# Patient Record
Sex: Female | Born: 1975 | Race: White | Hispanic: No | Marital: Married | Smoking: Current every day smoker
Health system: Southern US, Community
[De-identification: ages and names within clinical notes are randomized; demographics above are authoritative.]

---

## 2005-05-17 ENCOUNTER — Emergency Department: Payer: Self-pay | Admitting: Emergency Medicine

## 2008-01-20 ENCOUNTER — Ambulatory Visit: Payer: Self-pay | Admitting: Family Medicine

## 2008-01-20 DIAGNOSIS — S62619A Displaced fracture of proximal phalanx of unspecified finger, initial encounter for closed fracture: Secondary | ICD-10-CM | POA: Insufficient documentation

## 2008-03-30 ENCOUNTER — Observation Stay: Payer: Self-pay | Admitting: Obstetrics & Gynecology

## 2008-07-03 ENCOUNTER — Observation Stay: Payer: Self-pay | Admitting: Obstetrics and Gynecology

## 2008-08-08 ENCOUNTER — Observation Stay: Payer: Self-pay | Admitting: Unknown Physician Specialty

## 2008-08-11 ENCOUNTER — Observation Stay: Payer: Self-pay | Admitting: Unknown Physician Specialty

## 2008-08-11 ENCOUNTER — Inpatient Hospital Stay: Payer: Self-pay | Admitting: Unknown Physician Specialty

## 2010-10-27 ENCOUNTER — Ambulatory Visit: Payer: Self-pay | Admitting: Internal Medicine

## 2013-06-11 ENCOUNTER — Ambulatory Visit: Payer: Self-pay

## 2018-05-02 ENCOUNTER — Other Ambulatory Visit: Payer: Self-pay | Admitting: Nurse Practitioner

## 2018-05-02 ENCOUNTER — Telehealth: Payer: Self-pay

## 2018-05-02 DIAGNOSIS — Z3041 Encounter for surveillance of contraceptive pills: Secondary | ICD-10-CM

## 2018-05-02 MED ORDER — NORGESTIM-ETH ESTRAD TRIPHASIC 0.18/0.215/0.25 MG-35 MCG PO TABS: 1.0000 | ORAL_TABLET | Freq: Every day | ORAL | 11 refills | Status: DC

## 2018-05-02 NOTE — Telephone Encounter (Signed)
Sent prescription for ortho tri cyclen to [facility] in [place]. She can keep usual appointment.

## 2018-05-02 NOTE — Telephone Encounter (Signed)
PT WAS NOTIFIED. 

## 2018-05-02 NOTE — Progress Notes (Signed)
Sent prescription for ortho tri cyclen to [facility] in [place]. She can keep usual appointment.

## 2018-08-13 ENCOUNTER — Other Ambulatory Visit: Payer: Self-pay | Admitting: Adult Health

## 2020-07-19 ENCOUNTER — Telehealth: Payer: Self-pay

## 2020-07-19 NOTE — Telephone Encounter (Signed)
LMOM of office visit on 8/11

## 2020-07-21 ENCOUNTER — Ambulatory Visit: Payer: Medicaid Other | Admitting: Adult Health

## 2020-07-21 ENCOUNTER — Other Ambulatory Visit: Payer: Self-pay

## 2020-07-21 ENCOUNTER — Encounter: Payer: Self-pay | Admitting: Adult Health

## 2020-07-21 VITALS — BP 121/79 | HR 77 | Temp 97.6°F | Resp 16 | Ht 68.0 in | Wt 131.8 lb

## 2020-07-21 DIAGNOSIS — M26621 Arthralgia of right temporomandibular joint: Secondary | ICD-10-CM

## 2020-07-21 DIAGNOSIS — Z7689 Persons encountering health services in other specified circumstances: Secondary | ICD-10-CM | POA: Diagnosis not present

## 2020-07-21 NOTE — Progress Notes (Signed)
[facility] [address] [place], [place] [address]  Internal MEDICINE  Office Visit Note  Patient Name: Karen Rollins  [id number]  [id number]  Date of Service: 07/27/2020   Complaints/HPI Pt is here for establishment of PCP. Chief Complaint  Patient presents with  . New Patient (Initial Visit)    pt fell 5 weeks ago, jaw pain, bite was off but it feels better  . Quality Metric Gaps    HepC, HIV screening, TDAP, PAP   HPI Pt is here to reestablish care. She has not been seen by a pCP for over 2 years.  She is currently separated, and has two children 51, and 38 years old.   She does not take any daily medications.  She reports 5 weeks ago while hiking, she slipped and her jaw hit her mandible on the rock after slipping.  She denies an LOC.  She does have a healing laceration submandibular.  She is concerned because the pain in the Right TMJ continues to feel discomfort.  She has good range of motion with her mandible, however it does cause discomfort with certain movements.   She reports she smokes 10-15 cigarettes daily.  She drinks alcohol occassionally,  She does use marijuana weekly.     Current Medication: Outpatient Encounter Medications as of 07/21/2020  Medication Sig  . [DISCONTINUED] Norgestimate-Ethinyl Estradiol Triphasic (ORTHO TRI-CYCLEN, 28,) 0.18/0.215/0.25 MG-35 MCG tablet Take 1 tablet by mouth daily.   No facility-administered encounter medications on file as of 07/21/2020.    Surgical History: History reviewed. No pertinent surgical history.  Medical History: History reviewed. No pertinent past medical history.  Family History: Family History  Problem Relation Age of Onset  . Cancer Mother   . Hypertension Father   . Cancer Maternal Grandmother   . Stroke Paternal Grandmother     Social History   Socioeconomic History  . Marital status: Single    Spouse name: Not on file  . Number of children: Not on file  . Years of education:  Not on file  . Highest education level: Not on file  Occupational History  . Not on file  Tobacco Use  . Smoking status: Current Every Day Smoker    Packs/day: 0.50    Types: Cigarettes  . Smokeless tobacco: Never Used  Substance and Sexual Activity  . Alcohol use: Yes    Comment: occisionally  . Drug use: Yes    Types: Marijuana  . Sexual activity: Not on file  Other Topics Concern  . Not on file  Social History Narrative  . Not on file   Social Determinants of Health   Financial Resource Strain:   . Difficulty of Paying Living Expenses:   Food Insecurity:   . Worried About Programme researcher, broadcasting/film/video in the Last Year:   . Barista in the Last Year:   Transportation Needs:   . Freight forwarder (Medical):   Marland Kitchen Lack of Transportation (Non-Medical):   Physical Activity:   . Days of Exercise per Week:   . Minutes of Exercise per Session:   Stress:   . Feeling of Stress :   Social Connections:   . Frequency of Communication with Friends and Family:   . Frequency of Social Gatherings with Friends and Family:   . Attends Religious Services:   . Active Member of Clubs or Organizations:   . Attends Banker Meetings:   Marland Kitchen Marital Status:   Intimate Partner Violence:   .  Fear of Current or Ex-Partner:   . Emotionally Abused:   Marland Kitchen Physically Abused:   . Sexually Abused:      Review of Systems  Constitutional: Negative for chills, fatigue and unexpected weight change.  HENT: Negative for congestion, rhinorrhea, sneezing and sore throat.   Eyes: Negative for photophobia, pain and redness.  Respiratory: Negative for cough, chest tightness and shortness of breath.   Cardiovascular: Negative for chest pain and palpitations.  Gastrointestinal: Negative for abdominal pain, constipation, diarrhea, nausea and vomiting.  Endocrine: Negative.   Genitourinary: Negative for dysuria and frequency.  Musculoskeletal: Negative for arthralgias, back pain, joint swelling  and neck pain.  Skin: Negative for rash.  Allergic/Immunologic: Negative.   Neurological: Negative for tremors and numbness.  Hematological: Negative for adenopathy. Does not bruise/bleed easily.  Psychiatric/Behavioral: Negative for behavioral problems and sleep disturbance. The patient is not nervous/anxious.     Vital Signs: BP 121/79   Pulse 77   Temp 97.6 F (36.4 C)   Resp 16   Ht 5\' 8"  (1.727 m)   Wt 131 lb 12.8 oz (59.8 kg)   SpO2 98%   BMI 20.04 kg/m    Physical Exam Vitals and nursing note reviewed.  Constitutional:      General: She is not in acute distress.    Appearance: She is well-developed. She is not diaphoretic.  HENT:     Head: Normocephalic and atraumatic.     Mouth/Throat:     Pharynx: No oropharyngeal exudate.  Eyes:     Pupils: Pupils are equal, round, and reactive to light.  Neck:     Thyroid: No thyromegaly.     Vascular: No JVD.     Trachea: No tracheal deviation.  Cardiovascular:     Rate and Rhythm: Normal rate and regular rhythm.     Heart sounds: Normal heart sounds. No murmur heard.  No friction rub. No gallop.   Pulmonary:     Effort: Pulmonary effort is normal. No respiratory distress.     Breath sounds: Normal breath sounds. No wheezing or rales.  Chest:     Chest wall: No tenderness.  Abdominal:     Palpations: Abdomen is soft.     Tenderness: There is no abdominal tenderness. There is no guarding.  Musculoskeletal:        General: Normal range of motion.     Cervical back: Normal range of motion and neck supple.  Lymphadenopathy:     Cervical: No cervical adenopathy.  Skin:    General: Skin is warm and dry.  Neurological:     Mental Status: She is alert and oriented to person, place, and time.     Cranial Nerves: No cranial nerve deficit.  Psychiatric:        Behavior: Behavior normal.        Thought Content: Thought content normal.        Judgment: Judgment normal.    Assessment/Plan: 1. Encounter to establish care  with new doctor Update labs for baseline at this time.  - CBC with Differential/Platelet - Lipid Panel With LDL/HDL Ratio - TSH - T4, free - Comprehensive metabolic panel  2. Arthralgia of right temporomandibular joint - CT MAXILLOFACIAL WO CONTRAST; Future  General Counseling: Junior verbalizes understanding of the findings of todays visit and agrees with plan of treatment. I have discussed any further diagnostic evaluation that may be needed or ordered today. We also reviewed her medications today. she has been encouraged to call the office with  any questions or concerns that should arise related to todays visit.  Orders Placed This Encounter  Procedures  . CT MAXILLOFACIAL WO CONTRAST  . CBC with Differential/Platelet  . Lipid Panel With LDL/HDL Ratio  . TSH  . T4, free  . Comprehensive metabolic panel    No orders of the defined types were placed in this encounter.   Time spent: 30 Minutes   This patient was seen by Blima Ledger AGNP-C in Collaboration with Dr Lyndon Code as a part of collaborative care agreement  Johnna Acosta AGNP-C Internal Medicine

## 2020-07-22 DIAGNOSIS — Z7689 Persons encountering health services in other specified circumstances: Secondary | ICD-10-CM

## 2020-08-17 ENCOUNTER — Ambulatory Visit
Admission: RE | Admit: 2020-08-17 | Discharge: 2020-08-17 | Disposition: A | Discharge status: Home / Self Care | Payer: Medicaid Other | Source: Ambulatory Visit | Attending: Adult Health | Admitting: Adult Health

## 2020-08-17 ENCOUNTER — Other Ambulatory Visit: Payer: Self-pay

## 2020-08-17 DIAGNOSIS — M26621 Arthralgia of right temporomandibular joint: Secondary | ICD-10-CM | POA: Insufficient documentation

## 2020-09-01 ENCOUNTER — Other Ambulatory Visit: Payer: Medicaid Other | Admitting: Hospice and Palliative Medicine

## 2020-09-29 ENCOUNTER — Other Ambulatory Visit: Payer: Self-pay

## 2020-09-29 ENCOUNTER — Encounter (INDEPENDENT_AMBULATORY_CARE_PROVIDER_SITE_OTHER): Payer: Self-pay

## 2020-09-29 ENCOUNTER — Ambulatory Visit: Payer: Medicaid Other | Admitting: Hospice and Palliative Medicine

## 2020-09-29 ENCOUNTER — Encounter: Payer: Self-pay | Admitting: Hospice and Palliative Medicine

## 2020-09-29 VITALS — BP 128/90 | HR 79 | Temp 97.6°F | Resp 16 | Ht 68.0 in | Wt 133.0 lb

## 2020-09-29 DIAGNOSIS — Z124 Encounter for screening for malignant neoplasm of cervix: Secondary | ICD-10-CM | POA: Diagnosis not present

## 2020-09-29 DIAGNOSIS — F411 Generalized anxiety disorder: Secondary | ICD-10-CM

## 2020-09-29 DIAGNOSIS — Z1231 Encounter for screening mammogram for malignant neoplasm of breast: Secondary | ICD-10-CM

## 2020-09-29 DIAGNOSIS — Z0001 Encounter for general adult medical examination with abnormal findings: Secondary | ICD-10-CM

## 2020-09-29 DIAGNOSIS — Z113 Encounter for screening for infections with a predominantly sexual mode of transmission: Secondary | ICD-10-CM | POA: Diagnosis not present

## 2020-09-29 DIAGNOSIS — R300 Dysuria: Secondary | ICD-10-CM

## 2020-09-29 NOTE — Progress Notes (Signed)
[facility] [address] [place], [place] [address]  Internal MEDICINE  Office Visit Note  Patient Name: Karen Rollins  [id number]  [id number]  Date of Service: 10/02/2020  Chief Complaint  Patient presents with  . Annual Exam  . Quality Metric Gaps    flu,tetnaus,covid,hep C  . controlled substance form    reviewed with PT     HPI Pt is here for routine health maintenance examination This is her second visit with [place] after establishing care as a new patient She has not yet had a chance to have her labs drawn We reviewed her CT scan of her face--had a fall on a rock and wanted to make sure she had not broken her jaw--CT negative for acute processes, pain and bruising has resolved at this time Discussed her mental health--she is moving back in with her husband whom she has been separated from for a few months, she is anxious about this move, she is happy to be getting back together with him but there are complications with his child from a previous relationship As for her stress and anxiety she does meditation and well as yoga Sleeps well each night, averages about 7-8 hours, eating and drinking with no difficulty She is currently out of a job and 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   No facility-administered encounter medications on file as of 09/29/2020.    Surgical History: History reviewed. No pertinent surgical history.  Medical History: History reviewed. No pertinent past medical history.  Family History: Family History  Problem Relation Age of Onset  . Cancer Mother   . Hypertension Father   . Cancer Maternal Grandmother   . Stroke Paternal Grandmother       Review of Systems   Constitutional: Negative for chills, diaphoresis and fatigue.  HENT: Negative for ear pain, postnasal drip and sinus pressure.   Eyes: Negative for photophobia, discharge, redness, itching and visual disturbance.  Respiratory: Negative for cough, shortness of breath and wheezing.   Cardiovascular: Negative for chest pain, palpitations and leg swelling.  Gastrointestinal: Negative for abdominal pain, constipation, diarrhea, nausea and vomiting.  Genitourinary: Negative for dysuria and flank pain.  Musculoskeletal: Negative for arthralgias, back pain, gait problem and neck pain.  Skin: Negative for color change.  Allergic/Immunologic: Negative for environmental allergies and food allergies.  Neurological: Negative for dizziness and headaches.  Hematological: Does not bruise/bleed easily.  Psychiatric/Behavioral: Negative for agitation, behavioral problems (depression) and hallucinations.     Vital Signs: BP 128/90   Pulse 79   Temp 97.6 F (36.4 C)   Resp 16   Ht 5\' 8"  (1.727 m)   Wt 133 lb (60.3 kg)   SpO2 99%   BMI 20.22 kg/m    Physical Exam Vitals reviewed.  Constitutional:      Appearance: Normal appearance. She is normal weight.  HENT:     Mouth/Throat:     Mouth: Mucous membranes are moist.     Pharynx: Oropharynx is clear.  Cardiovascular:     Rate and Rhythm: Normal rate and regular rhythm.     Pulses: Normal pulses.     Heart sounds: Normal heart sounds.  Pulmonary:     Effort: Pulmonary effort is normal.  Breath sounds: Normal breath sounds.  Chest:     Breasts:        Right: Normal.        Left: Normal.  Abdominal:     General: Abdomen is flat.     Palpations: Abdomen is soft.  Genitourinary:    General: Normal vulva.     Vagina: Normal.     Cervix: Normal.     Uterus: Normal.      Adnexa: Right adnexa normal and left adnexa normal.  Musculoskeletal:        General: Normal range of motion.     Cervical back: Normal range of motion.  Skin:     General: Skin is warm.  Neurological:     General: No focal deficit present.     Mental Status: She is alert and oriented to person, place, and time. Mental status is at baseline.  Psychiatric:        Mood and Affect: Mood is anxious.        Behavior: Behavior normal.        Thought Content: Thought content normal.     Comments: Appears anxious--fidgeting, jumps from multiple topics of conversations    LABS: Recent Results (from the past 2160 hour(s))  NuSwab Vaginitis Plus (VG+)     Status: Abnormal   Collection Time: 09/29/20 12:09 PM  Result Value Ref Range   Atopobium vaginae Moderate - 1 Score   BVAB 2 Moderate - 1 Score   Megasphaera 1 High - 2 (A) Score    Comment: Calculate total score by adding the 3 individual bacterial vaginosis (BV) marker scores together.  Total score is interpreted as follows: Total score 0-1: Indicates the absence of BV. Total score   2: Indeterminate for BV. Additional clinical                  data should be evaluated to establish a                  diagnosis. Total score 3-6: Indicates the presence of BV. This test was developed and its performance characteristics determined by [facility].  It has not been cleared or approved by the Food and Drug Administration.    Candida albicans, NAA Negative Negative   Candida glabrata, NAA Negative Negative   Trich vag by NAA Negative Negative   Chlamydia trachomatis, NAA Negative Negative   Neisseria gonorrhoeae, NAA Negative Negative  UA/M w/rflx Culture, Routine     Status: None   Collection Time: 09/29/20 12:09 PM   Specimen: Urine   Urine  Result Value Ref Range   Specific Gravity, UA 1.007 1.005 - 1.030   pH, UA 7.0 5.0 - 7.5   Color, UA Yellow Yellow   Appearance Ur Clear Clear   Leukocytes,UA Negative Negative   Protein,UA Negative Negative/Trace   Glucose, UA Negative Negative   Ketones, UA Negative Negative   RBC, UA Negative Negative   Bilirubin, UA Negative Negative   Urobilinogen, Ur  0.2 0.2 - 1.0 mg/dL   Nitrite, UA Negative Negative   Microscopic Examination Comment     Comment: Microscopic follows if indicated.   Microscopic Examination See below:     Comment: Microscopic was indicated and was performed.   Urinalysis Reflex Comment     Comment: This specimen will not reflex to a Urine Culture.  Microscopic Examination     Status: Abnormal   Collection Time: 09/29/20 12:09 PM   Urine  Result Value Ref Range  WBC, UA None seen 0 - 5 /hpf   RBC 3-10 (A) 0 - 2 /hpf   Epithelial Cells (non renal) None seen 0 - 10 /hpf   Casts None seen None seen /lpf   Bacteria, UA None seen None seen/Few    Assessment/Plan: 1. Encounter for routine adult health examination with abnormal findings Well appearing 44 year old female, advised to have labs drawn for review  2. Encounter for screening mammogram for malignant neoplasm of breast Screening mammogram per guidelines - MM Digital Screening; Future  3. Generalized anxiety disorder Continue to manage anxiety through meditation and yoga--not interested in medication at this time, will continue to closely monitor  4. Routine cervical smear - IGP, Aptima HPV  5. Screening examination for venereal disease - NuSwab Vaginitis Plus (VG+)  6. Dysuria - UA/M w/rflx Culture, Routine - Microscopic Examination  General Counseling: Juley verbalizes understanding of the findings of todays visit and agrees with plan of treatment. I have discussed any further diagnostic evaluation that may be needed or ordered today. We also reviewed her medications today. she has been encouraged to call the office with any questions or concerns that should arise related to todays visit.    Counseling:    Orders Placed This Encounter  Procedures  . Microscopic Examination  . MM Digital Screening  . NuSwab Vaginitis Plus (VG+)  . UA/M w/rflx Culture, Routine      Total time spent: 30 Minutes  Time spent includes review of chart,  medications, test results, and follow up plan with the patient.   This patient was seen by Brent General AGNP-C Collaboration with Dr Lyndon Code as a part of collaborative care agreement   Lubertha Basque. [facility] Internal Medicine

## 2020-09-30 LAB — UA/M W/RFLX CULTURE, ROUTINE
Bilirubin, UA: NEGATIVE
Glucose, UA: NEGATIVE
Ketones, UA: NEGATIVE
Leukocytes,UA: NEGATIVE
Nitrite, UA: NEGATIVE
Protein,UA: NEGATIVE
RBC, UA: NEGATIVE
Specific Gravity, UA: 1.007 (ref 1.005–1.030)
Urobilinogen, Ur: 0.2 mg/dL (ref 0.2–1.0)
pH, UA: 7 (ref 5.0–7.5)

## 2020-09-30 LAB — MICROSCOPIC EXAMINATION
Bacteria, UA: NONE SEEN
Casts: NONE SEEN /lpf
Epithelial Cells (non renal): NONE SEEN /hpf (ref 0–10)
WBC, UA: NONE SEEN /hpf (ref 0–5)

## 2020-10-01 ENCOUNTER — Other Ambulatory Visit: Payer: Self-pay | Admitting: Hospice and Palliative Medicine

## 2020-10-01 ENCOUNTER — Telehealth: Payer: Self-pay

## 2020-10-01 DIAGNOSIS — Z1231 Encounter for screening mammogram for malignant neoplasm of breast: Secondary | ICD-10-CM

## 2020-10-01 LAB — NUSWAB VAGINITIS PLUS (VG+)
Candida albicans, NAA: NEGATIVE
Candida glabrata, NAA: NEGATIVE
Chlamydia trachomatis, NAA: NEGATIVE
Megasphaera 1: HIGH Score — AB
Neisseria gonorrhoeae, NAA: NEGATIVE
Trich vag by NAA: NEGATIVE

## 2020-10-01 MED ORDER — METRONIDAZOLE 500 MG PO TABS
500.0000 mg | ORAL_TABLET | Freq: Two times a day (BID) | ORAL | 0 refills | Status: DC
Start: 2020-10-01 — End: 2022-11-10

## 2020-10-01 NOTE — Telephone Encounter (Signed)
-----   Message from Theotis Burrow, NP sent at 10/01/2020 12:42 PM EDT ----- Please call patient and let her know her vaginal swab came back positive for bacterial vaginosis. I have called in metronidazole 500 mg BID for 7 days. Avoid all alcohol while taking this medication.

## 2020-10-01 NOTE — Telephone Encounter (Signed)
Pt.notified

## 2020-10-01 NOTE — Progress Notes (Signed)
Please call patient and let her know her vaginal swab came back positive for bacterial vaginosis. I have called in metronidazole 500 mg BID for 7 days. Avoid all alcohol while taking this medication.

## 2020-10-02 ENCOUNTER — Encounter: Payer: Self-pay | Admitting: Hospice and Palliative Medicine

## 2020-10-06 LAB — IGP, APTIMA HPV: HPV Aptima: NEGATIVE

## 2020-10-07 LAB — LIPID PANEL WITH LDL/HDL RATIO

## 2020-10-08 LAB — LIPID PANEL WITH LDL/HDL RATIO
Cholesterol, Total: 265 mg/dL — ABNORMAL HIGH (ref 100–199)
HDL: 52 mg/dL (ref >39)
LDL Chol Calc (NIH): 203 mg/dL — ABNORMAL HIGH (ref 0–99)
LDL/HDL Ratio: 3.9 ratio — ABNORMAL HIGH (ref 0.0–3.2)
Triglycerides: 66 mg/dL (ref 0–149)
VLDL Cholesterol Cal: 10 mg/dL (ref 5–40)

## 2020-10-08 LAB — COMPREHENSIVE METABOLIC PANEL
ALT: 9 IU/L (ref 0–32)
AST: 12 IU/L (ref 0–40)
Albumin/Globulin Ratio: 1.8 (ref 1.2–2.2)
Albumin: 4.2 g/dL (ref 3.8–4.8)
Alkaline Phosphatase: 75 IU/L (ref 44–121)
BUN/Creatinine Ratio: 14 (ref 9–23)
BUN: 10 mg/dL (ref 6–24)
Bilirubin Total: 0.4 mg/dL (ref 0.0–1.2)
CO2: 22 mmol/L (ref 20–29)
Calcium: 9.2 mg/dL (ref 8.7–10.2)
Chloride: 104 mmol/L (ref 96–106)
Creatinine, Ser: 0.74 mg/dL (ref 0.57–1.00)
GFR calc Af Amer: 116 mL/min/{1.73_m2} (ref >59)
GFR calc non Af Amer: 100 mL/min/{1.73_m2} (ref >59)
Globulin, Total: 2.3 g/dL (ref 1.5–4.5)
Glucose: 86 mg/dL (ref 65–99)
Potassium: 4.1 mmol/L (ref 3.5–5.2)
Sodium: 139 mmol/L (ref 134–144)
Total Protein: 6.5 g/dL (ref 6.0–8.5)

## 2020-10-08 LAB — CBC WITH DIFFERENTIAL/PLATELET
Basophils Absolute: 0 10*3/uL (ref 0.0–0.2)
Basos: 1 %
EOS (ABSOLUTE): 0.3 10*3/uL (ref 0.0–0.4)
Eos: 5 %
Hematocrit: 40 % (ref 34.0–46.6)
Hemoglobin: 13.2 g/dL (ref 11.1–15.9)
Immature Grans (Abs): 0 10*3/uL (ref 0.0–0.1)
Immature Granulocytes: 0 %
Lymphocytes Absolute: 2 10*3/uL (ref 0.7–3.1)
Lymphs: 31 %
MCH: 31.7 pg (ref 26.6–33.0)
MCHC: 33 g/dL (ref 31.5–35.7)
MCV: 96 fL (ref 79–97)
Monocytes Absolute: 0.5 10*3/uL (ref 0.1–0.9)
Monocytes: 7 %
Neutrophils Absolute: 3.7 10*3/uL (ref 1.4–7.0)
Neutrophils: 56 %
Platelets: 261 10*3/uL (ref 150–450)
RBC: 4.17 x10E6/uL (ref 3.77–5.28)
RDW: 12.3 % (ref 11.7–15.4)
WBC: 6.6 10*3/uL (ref 3.4–10.8)

## 2020-10-08 LAB — T4, FREE: Free T4: 1.27 ng/dL (ref 0.82–1.77)

## 2020-10-08 LAB — TSH: TSH: 0.91 u[IU]/mL (ref 0.450–4.500)

## 2020-10-26 ENCOUNTER — Telehealth: Payer: Self-pay

## 2020-10-26 NOTE — Telephone Encounter (Signed)
-----   Message from Lyndon Code, MD sent at 10/22/2020  6:46 PM EST ----- Her cholesterol is high, can you please ask her if she was fasting for her labs

## 2020-10-27 ENCOUNTER — Telehealth: Payer: Self-pay

## 2020-10-27 ENCOUNTER — Other Ambulatory Visit: Payer: Self-pay | Admitting: Hospice and Palliative Medicine

## 2020-10-27 DIAGNOSIS — Z13220 Encounter for screening for lipoid disorders: Secondary | ICD-10-CM

## 2020-10-27 NOTE — Telephone Encounter (Signed)
LMOM to let pt know to have another lab done for lipid panel and to make sure she is fasting for 8 hours.

## 2020-10-27 NOTE — Telephone Encounter (Signed)
-----   Message from Theotis Burrow, NP sent at 10/27/2020 12:42 PM EST ----- Please call her back and have her repeat her lipid panel--make sure she understands she needs to fast for 8 hours prior to having labs done. Thanks.

## 2020-11-10 ENCOUNTER — Other Ambulatory Visit: Payer: Self-pay

## 2020-11-10 ENCOUNTER — Ambulatory Visit
Admission: RE | Admit: 2020-11-10 | Discharge: 2020-11-10 | Disposition: A | Discharge status: Home / Self Care | Payer: Medicaid Other | Source: Ambulatory Visit | Attending: Hospice and Palliative Medicine | Admitting: Hospice and Palliative Medicine

## 2020-11-10 DIAGNOSIS — Z1231 Encounter for screening mammogram for malignant neoplasm of breast: Secondary | ICD-10-CM | POA: Insufficient documentation

## 2020-11-12 ENCOUNTER — Other Ambulatory Visit: Payer: Self-pay | Admitting: Hospice and Palliative Medicine

## 2020-11-12 DIAGNOSIS — N6489 Other specified disorders of breast: Secondary | ICD-10-CM

## 2020-11-12 DIAGNOSIS — R921 Mammographic calcification found on diagnostic imaging of breast: Secondary | ICD-10-CM

## 2020-11-12 DIAGNOSIS — R928 Other abnormal and inconclusive findings on diagnostic imaging of breast: Secondary | ICD-10-CM

## 2020-11-19 ENCOUNTER — Other Ambulatory Visit: Payer: Self-pay

## 2020-11-19 ENCOUNTER — Ambulatory Visit
Admission: RE | Admit: 2020-11-19 | Discharge: 2020-11-19 | Disposition: A | Discharge status: Home / Self Care | Payer: Medicaid Other | Source: Ambulatory Visit | Attending: Hospice and Palliative Medicine | Admitting: Hospice and Palliative Medicine

## 2020-11-19 DIAGNOSIS — R928 Other abnormal and inconclusive findings on diagnostic imaging of breast: Secondary | ICD-10-CM

## 2020-11-19 DIAGNOSIS — R921 Mammographic calcification found on diagnostic imaging of breast: Secondary | ICD-10-CM | POA: Diagnosis present

## 2020-11-19 DIAGNOSIS — N6489 Other specified disorders of breast: Secondary | ICD-10-CM

## 2021-01-27 ENCOUNTER — Ambulatory Visit: Payer: Medicaid Other | Admitting: Physician Assistant

## 2021-01-27 ENCOUNTER — Telehealth: Payer: Self-pay

## 2021-01-27 NOTE — Telephone Encounter (Signed)
BILLED MISSED APPOINTMENT FEE 01/27/21.

## 2021-02-23 IMAGING — MG DIGITAL SCREENING BILAT W/ TOMO W/ CAD
6 of 10 series · 6 of 30 positions shown · non-contrast
Comparison: Previous exam(s).

CLINICAL DATA: Screening.

EXAM:
DIGITAL SCREENING BILATERAL MAMMOGRAM WITH TOMO AND CAD

[L CC synth-2D]
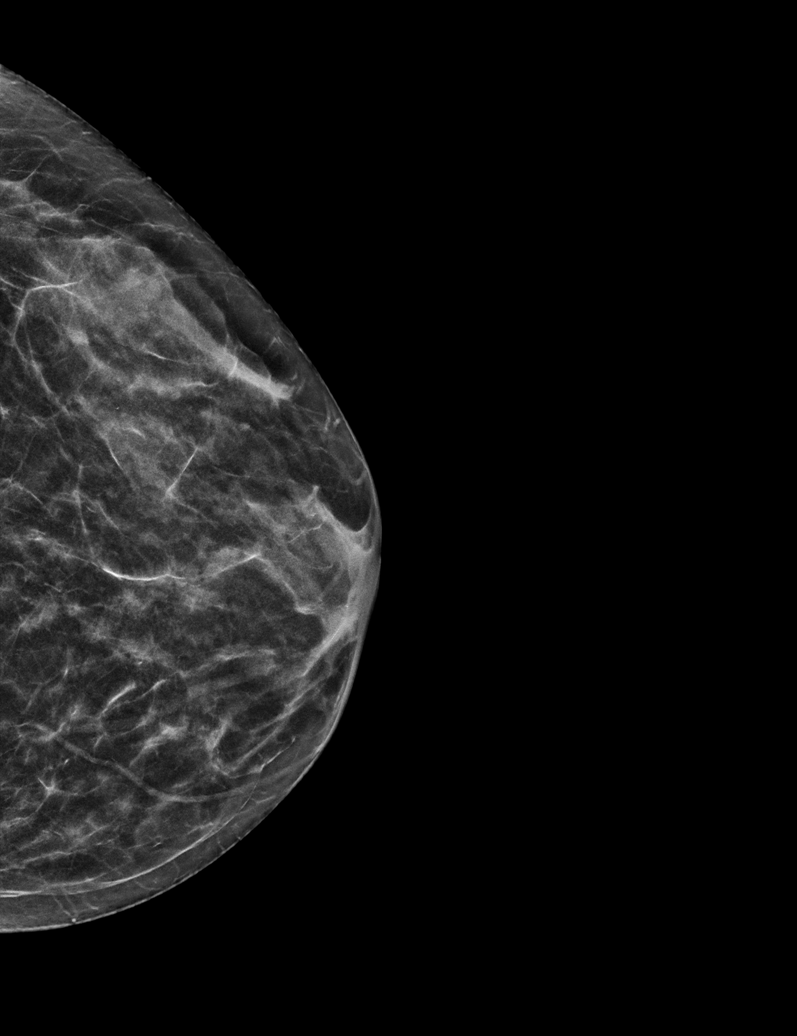

[R MLO synth-2D]
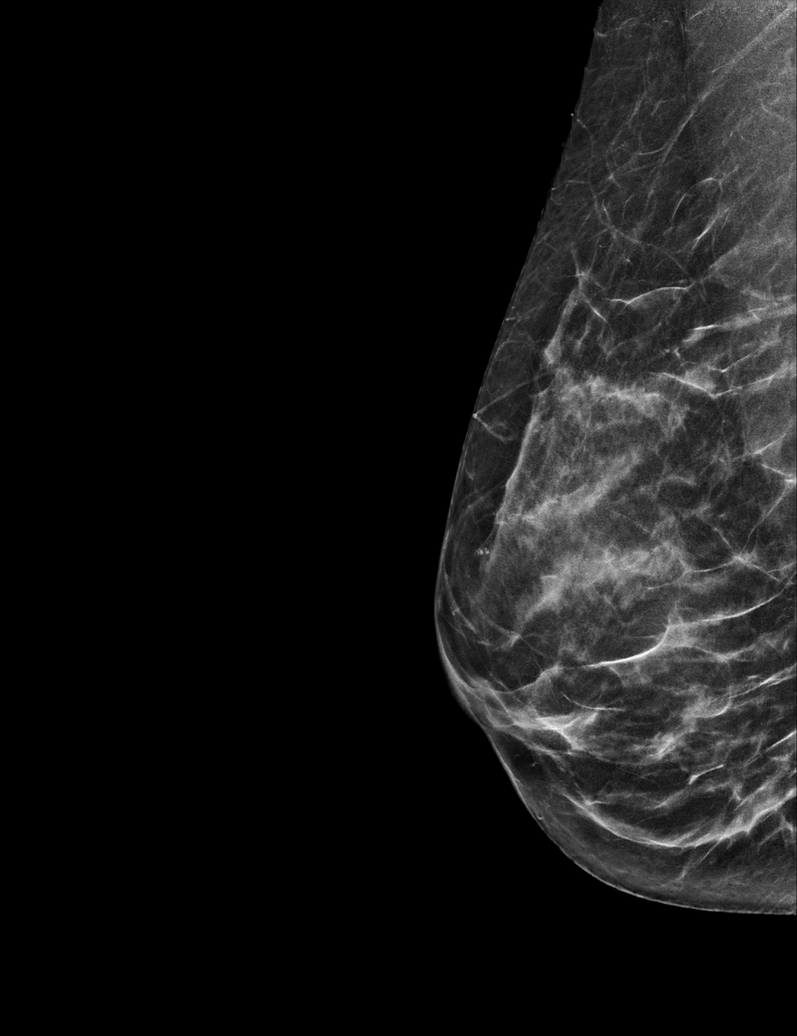

[L MLO synth-2D]
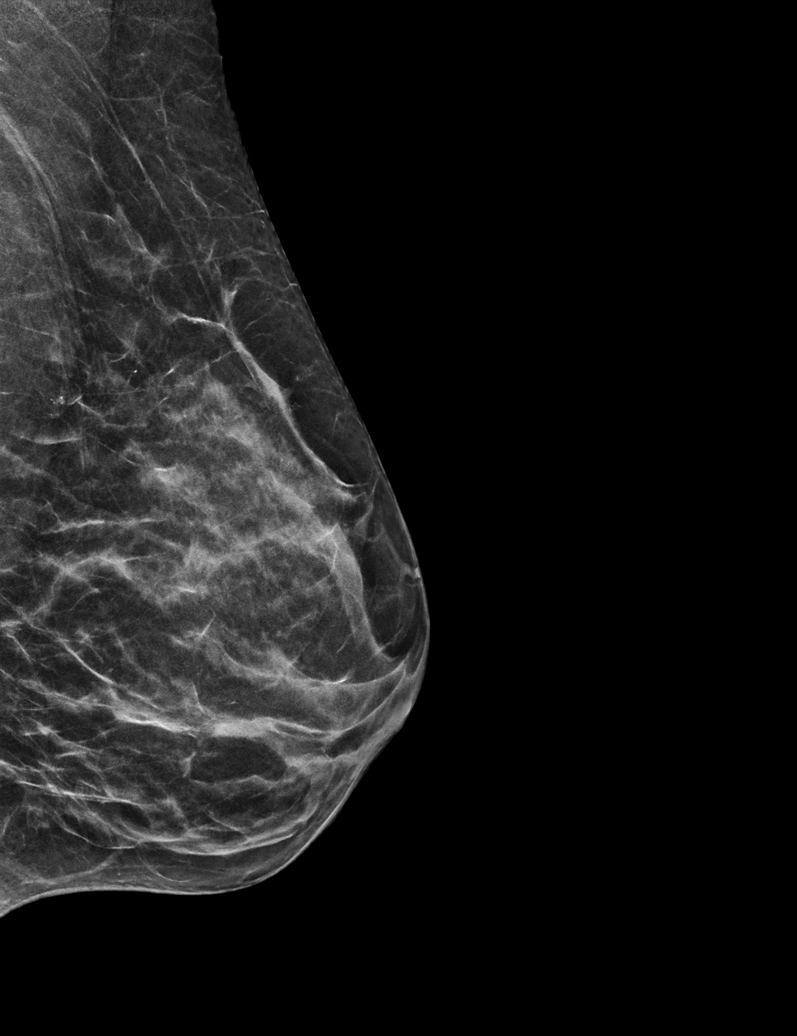

[R CC synth-2D (1 of 2)]
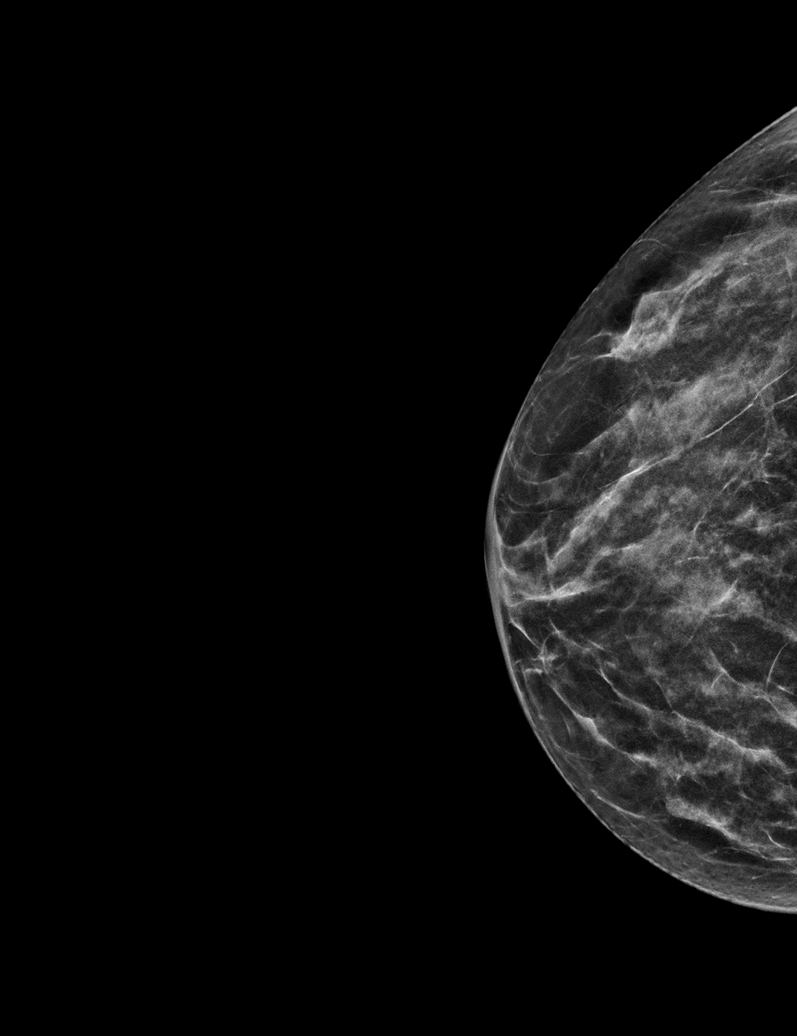

[R CC synth-2D (2 of 2)]
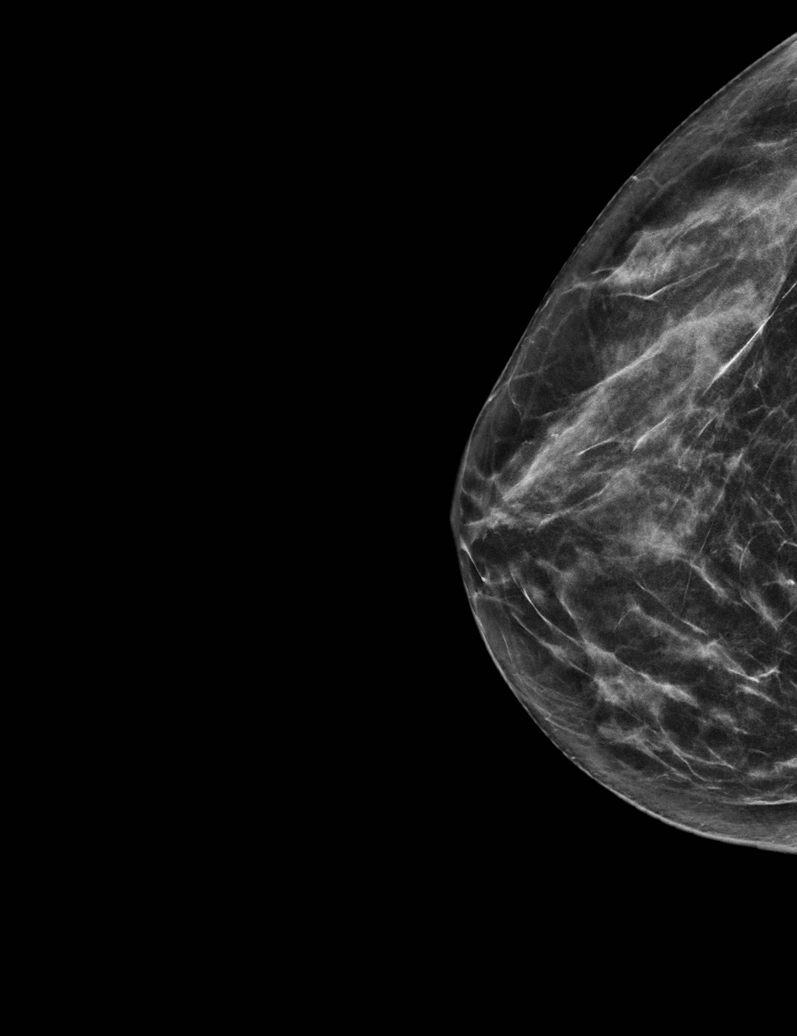

[R CC tomo · tomo slice 21/40.0]
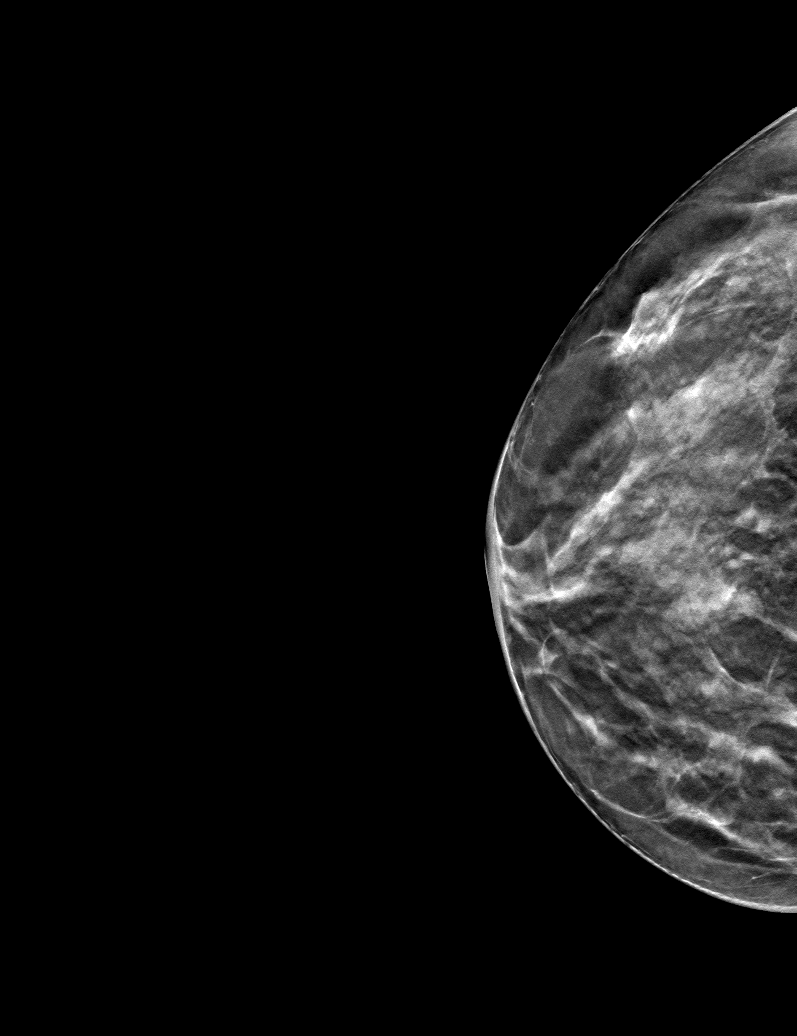

[6 of 30 positions shown; findings below may reference images not displayed]

ACR Breast Density Category c: The breast tissue is heterogeneously
dense, which may obscure small masses.
FINDINGS: In the right breast a possible asymmetry requires further
evaluation.

In the left breast a group of calcifications and a possible
asymmetry require further evaluation.

Images were processed with CAD.
IMPRESSION: Further evaluation is suggested for possible asymmetry in the right
breast.

Further evaluation is suggested for possible asymmetry and
calcifications in the left breast.

RECOMMENDATION:
Diagnostic mammogram and possibly ultrasound of both breasts.
(Code:55-V-XXB)

The patient will be contacted regarding the findings, and additional
imaging will be scheduled.

BI-RADS CATEGORY  0: Incomplete. Need additional imaging evaluation
and/or prior mammograms for comparison.

## 2021-10-04 ENCOUNTER — Other Ambulatory Visit: Payer: Medicaid Other | Admitting: Nurse Practitioner

## 2022-11-10 ENCOUNTER — Ambulatory Visit: Payer: Medicaid Other | Admitting: Physician Assistant

## 2022-11-10 ENCOUNTER — Encounter: Payer: Self-pay | Admitting: Physician Assistant

## 2022-11-10 VITALS — BP 128/68 | HR 105 | Temp 98.2°F | Resp 16 | Ht 68.0 in | Wt 121.0 lb

## 2022-11-10 DIAGNOSIS — Z91199 Patient's noncompliance with other medical treatment and regimen due to unspecified reason: Secondary | ICD-10-CM

## 2022-11-10 DIAGNOSIS — Z0001 Encounter for general adult medical examination with abnormal findings: Secondary | ICD-10-CM

## 2022-11-10 NOTE — Progress Notes (Signed)
[facility] [address] [place], [place] [address]  Internal MEDICINE  Office Visit Note  Patient Name: Karen Rollins  [id number]  [id number]  Date of Service: 11/19/2022  Chief Complaint  Patient presents with   Annual Exam     HPI Pt is here for routine health maintenance examination -Patient has not been seen in office for 2 years -She declines mammogram and breast exam -Not interested in any labs at this time and also declines pap -upset today due to hearing cost of crown from dentist prior to visit. She reports disgust with health system in general and reports she is only present for exam today at urging of significant other. She has no interest in health exam and does not have any complaints related to her health today -BP and HR initially elevated due to being upset, but did improve throughout exam -14yo son giving her trouble as well and adds to her being upset today. She also has a 23 yo child  Current Medication: Outpatient Encounter Medications as of 11/10/2022  Medication Sig   [DISCONTINUED] metroNIDAZOLE (FLAGYL) 500 MG tablet Take 1 tablet (500 mg total) by mouth 2 (two) times daily.   No facility-administered encounter medications on file as of 11/10/2022.    Surgical History: History reviewed. No pertinent surgical history.  Medical History: History reviewed. No pertinent past medical history.  Family History: Family History  Problem Relation Age of Onset   Cancer Mother    Breast cancer Mother 31   Hypertension Father    Cancer Maternal Grandmother    Breast cancer Maternal Grandmother    Stroke Paternal Grandmother    Breast cancer Paternal Grandmother       Review of Systems  Constitutional:  Negative for chills, diaphoresis and fatigue.  HENT:  Negative for ear pain, postnasal drip and sinus pressure.   Eyes:  Negative for photophobia, discharge, redness, itching and visual disturbance.  Respiratory:  Negative for  cough, shortness of breath and wheezing.   Cardiovascular:  Negative for chest pain, palpitations and leg swelling.  Gastrointestinal:  Negative for abdominal pain, constipation, diarrhea, nausea and vomiting.  Genitourinary:  Negative for dysuria and flank pain.  Musculoskeletal:  Negative for arthralgias, back pain, gait problem and neck pain.  Skin:  Negative for color change.  Allergic/Immunologic: Negative for environmental allergies and food allergies.  Neurological:  Negative for dizziness and headaches.  Hematological:  Does not bruise/bleed easily.  Psychiatric/Behavioral:  Positive for agitation. Negative for behavioral problems (depression) and hallucinations.      Vital Signs: BP 128/68 Comment: 142/92  Pulse (!) 105   Temp 98.2 F (36.8 C)   Resp 16   Ht 5\' 8"  (1.727 m)   Wt 121 lb (54.9 kg)   SpO2 99%   BMI 18.40 kg/m    Physical Exam Vitals and nursing note reviewed.  Constitutional:      General: She is not in acute distress.    Appearance: Normal appearance. She is well-developed. She is not diaphoretic.  HENT:     Head: Normocephalic and atraumatic.     Mouth/Throat:     Pharynx: No oropharyngeal exudate.  Eyes:     Pupils: Pupils are equal, round, and reactive to light.  Neck:     Thyroid: No thyromegaly.     Vascular: No JVD.     Trachea: No tracheal deviation.  Cardiovascular:     Rate and Rhythm: Normal rate and regular rhythm.     Heart  sounds: Normal heart sounds. No murmur heard.    No friction rub. No gallop.  Pulmonary:     Effort: Pulmonary effort is normal. No respiratory distress.     Breath sounds: No wheezing or rales.  Chest:     Chest wall: No tenderness.  Abdominal:     General: Bowel sounds are normal.     Palpations: Abdomen is soft.     Tenderness: There is no abdominal tenderness.  Musculoskeletal:        General: Normal range of motion.     Cervical back: Normal range of motion and neck supple.  Lymphadenopathy:      Cervical: No cervical adenopathy.  Skin:    General: Skin is warm and dry.  Neurological:     Mental Status: She is alert and oriented to person, place, and time.     Cranial Nerves: No cranial nerve deficit.  Psychiatric:        Thought Content: Thought content normal.        Judgment: Judgment normal.     Comments: Pt agitated throughout exam for numerous reasons      LABS: No results found for this or any previous visit (from the past 2160 hour(s)).      Assessment/Plan: 1. Encounter for general adult medical examination with abnormal findings CPE performed, due for mammogram, pap, and routine labs, but declines all  2. Noncompliance Declines all screenings and lab recommendations   General Counseling: Karen Rollins verbalizes understanding of the findings of todays visit and agrees with plan of treatment. I have discussed any further diagnostic evaluation that may be needed or ordered today. We also reviewed her medications today. she has been encouraged to call the office with any questions or concerns that should arise related to todays visit.    Counseling:    No orders of the defined types were placed in this encounter.   No orders of the defined types were placed in this encounter.   This patient was seen by Lynn Ito, PA-C in collaboration with Dr. Beverely Risen as a part of collaborative care agreement.  Total time spent:30 Minutes  Time spent includes review of chart, medications, test results, and follow up plan with the patient.     Lyndon Code, MD  Internal Medicine

## 2023-11-16 ENCOUNTER — Encounter: Payer: Medicaid Other | Admitting: Physician Assistant

## 2023-12-20 ENCOUNTER — Ambulatory Visit (INDEPENDENT_AMBULATORY_CARE_PROVIDER_SITE_OTHER): Payer: Medicaid Other | Admitting: Physician Assistant

## 2023-12-20 ENCOUNTER — Encounter: Payer: Self-pay | Admitting: Physician Assistant

## 2023-12-20 VITALS — BP 130/75 | HR 104 | Temp 98.1°F | Resp 16 | Ht 68.0 in | Wt 119.2 lb

## 2023-12-20 DIAGNOSIS — L989 Disorder of the skin and subcutaneous tissue, unspecified: Secondary | ICD-10-CM | POA: Diagnosis not present

## 2023-12-20 DIAGNOSIS — Z1283 Encounter for screening for malignant neoplasm of skin: Secondary | ICD-10-CM | POA: Diagnosis not present

## 2023-12-20 DIAGNOSIS — Z113 Encounter for screening for infections with a predominantly sexual mode of transmission: Secondary | ICD-10-CM

## 2023-12-20 DIAGNOSIS — Z1212 Encounter for screening for malignant neoplasm of rectum: Secondary | ICD-10-CM

## 2023-12-20 DIAGNOSIS — Z1211 Encounter for screening for malignant neoplasm of colon: Secondary | ICD-10-CM | POA: Diagnosis not present

## 2023-12-20 DIAGNOSIS — Z1231 Encounter for screening mammogram for malignant neoplasm of breast: Secondary | ICD-10-CM | POA: Diagnosis not present

## 2023-12-20 DIAGNOSIS — Z124 Encounter for screening for malignant neoplasm of cervix: Secondary | ICD-10-CM

## 2023-12-20 DIAGNOSIS — Z0001 Encounter for general adult medical examination with abnormal findings: Secondary | ICD-10-CM

## 2023-12-20 NOTE — Progress Notes (Signed)
 [facility] [address] [place], [place] [address]  Internal MEDICINE  Office Visit Note  Patient Name: Karen Rollins  [id number]  [id number]  Date of Service: 12/20/2023  Chief Complaint  Patient presents with   Annual Exam   Quality Metric Gaps    TDAP     HPI Pt is here for routine health maintenance examination -Due for pap, previous pap unsatisfactory, denies hysterectomy -denies Fhx of colon screening, open to cologuard -overdue for mammogram, will order now -smoking 1/2 ppd, this is less than previously and is working on this -small bump on face, States she will pick at it, but hasnt changed really in years. Would beneift from full skin check -labs ordered  Current Medication: No outpatient encounter medications on file as of 12/20/2023.   No facility-administered encounter medications on file as of 12/20/2023.    Surgical History: History reviewed. No pertinent surgical history.  Medical History: History reviewed. No pertinent past medical history.  Family History: Family History  Problem Relation Age of Onset   Cancer Mother    Breast cancer Mother 23   Hypertension Father    Cancer Maternal Grandmother    Breast cancer Maternal Grandmother    Stroke Paternal Grandmother    Breast cancer Paternal Grandmother       Review of Systems  Constitutional:  Negative for chills, fatigue and unexpected weight change.  HENT:  Negative for congestion, rhinorrhea, sneezing and sore throat.   Eyes:  Negative for redness.  Respiratory:  Negative for cough, chest tightness and shortness of breath.   Cardiovascular:  Negative for chest pain and palpitations.  Gastrointestinal:  Negative for abdominal pain, constipation, diarrhea, nausea and vomiting.  Genitourinary:  Negative for dysuria and frequency.  Musculoskeletal:  Negative for arthralgias, back pain, joint swelling and neck pain.  Skin:  Negative for rash.       Skin lesion  Neurological:  Negative.  Negative for tremors and numbness.  Hematological:  Negative for adenopathy. Does not bruise/bleed easily.  Psychiatric/Behavioral:  Negative for behavioral problems (Depression), sleep disturbance and suicidal ideas. The patient is not nervous/anxious.      Vital Signs: BP 130/75   Pulse (!) 104   Temp 98.1 F (36.7 C)   Resp 16   Ht 5' 8 (1.727 m)   Wt 119 lb 3.2 oz (54.1 kg)   LMP 12/05/2023 (Exact Date)   SpO2 98%   BMI 18.12 kg/m    Physical Exam Exam conducted with a chaperone present.  Constitutional:      General: She is not in acute distress.    Appearance: She is well-developed. She is not diaphoretic.  HENT:     Head: Normocephalic and atraumatic.     Right Ear: External ear normal.     Left Ear: External ear normal.     Nose: Nose normal.     Mouth/Throat:     Pharynx: No oropharyngeal exudate.  Eyes:     Conjunctiva/sclera: Conjunctivae normal.     Pupils: Pupils are equal, round, and reactive to light.  Neck:     Thyroid: No thyromegaly.     Vascular: No JVD.     Trachea: No tracheal deviation.  Cardiovascular:     Rate and Rhythm: Normal rate and regular rhythm.     Heart sounds: Normal heart sounds. No murmur heard.    No friction rub. No gallop.  Pulmonary:     Effort: Pulmonary effort is normal. No respiratory distress.  Breath sounds: Normal breath sounds. No stridor. No wheezing or rales.  Chest:     Chest wall: No tenderness.  Breasts:    Right: Normal. No mass.     Left: Normal. No mass.  Abdominal:     General: Bowel sounds are normal.     Palpations: Abdomen is soft.     Tenderness: There is no abdominal tenderness.  Genitourinary:    Exam position: Lithotomy position.     Comments: Cervix not visualized, blind pap performed Musculoskeletal:        General: No tenderness or deformity. Normal range of motion.     Cervical back: Normal range of motion and neck supple.  Lymphadenopathy:     Cervical: No cervical  adenopathy.  Skin:    General: Skin is warm and dry.     Coloration: Skin is not pale.     Findings: No erythema or rash.  Neurological:     Mental Status: She is alert.     Cranial Nerves: No cranial nerve deficit.     Motor: No abnormal muscle tone.     Coordination: Coordination normal.     Deep Tendon Reflexes: Reflexes are normal and symmetric.  Psychiatric:        Behavior: Behavior normal.        Thought Content: Thought content normal.        Judgment: Judgment normal.      LABS: No results found for this or any previous visit (from the past 2160 hours).      Assessment/Plan: 1. Encounter for general adult medical examination with abnormal findings (Primary) Cpe performed, lab slip given, due for colon screening, mammogram, and pap  2. Skin lesion of face Will refer to dermatology for further evaluation as general exam would be beneficial - Ambulatory referral to Dermatology  3. Skin cancer screening - Ambulatory referral to Dermatology  4. Screening for colorectal cancer - Cologuard  5. Visit for screening mammogram - MM 3D SCREENING MAMMOGRAM BILATERAL BREAST; Future  6. Routine cervical smear - IGP, Aptima HPV  7. Screening for STDs (sexually transmitted diseases) - NuSwab Vaginitis Plus (VG+)   General Counseling: Karen Rollins verbalizes understanding of the findings of todays visit and agrees with plan of treatment. I have discussed any further diagnostic evaluation that may be needed or ordered today. We also reviewed her medications today. she has been encouraged to call the office with any questions or concerns that should arise related to todays visit.    Counseling:    Orders Placed This Encounter  Procedures   MM 3D SCREENING MAMMOGRAM BILATERAL BREAST   Cologuard   NuSwab Vaginitis Plus (VG+)   Ambulatory referral to Dermatology    No orders of the defined types were placed in this encounter.   This patient was seen by Tinnie Pro, PA-C in collaboration with Dr. Sigrid Bathe as a part of collaborative care agreement.  Total time spent:35 Minutes  Time spent includes review of chart, medications, test results, and follow up plan with the patient.     Sigrid CHRISTELLA Bathe, MD  Internal Medicine

## 2023-12-23 LAB — NUSWAB VAGINITIS PLUS (VG+)
Candida albicans, NAA: NEGATIVE
Candida glabrata, NAA: NEGATIVE

## 2023-12-26 ENCOUNTER — Telehealth: Payer: Self-pay | Admitting: Physician Assistant

## 2023-12-26 ENCOUNTER — Other Ambulatory Visit: Payer: Self-pay | Admitting: Physician Assistant

## 2023-12-26 ENCOUNTER — Telehealth: Payer: Self-pay

## 2023-12-26 DIAGNOSIS — R87615 Unsatisfactory cytologic smear of cervix: Secondary | ICD-10-CM

## 2023-12-26 LAB — IGP, APTIMA HPV: HPV Aptima: NEGATIVE

## 2023-12-26 NOTE — Telephone Encounter (Signed)
 Spoke with patient regarding Pap and GYN referral.

## 2023-12-26 NOTE — Telephone Encounter (Signed)
-----   Message from Jacques Mattock sent at 12/26/2023 12:41 PM EST ----- Please let her know that unfortunately her pap did come back unsatisfactory, but her HPV test was negative. I would like to refer her to GYN for repeat pap and will place this now.

## 2023-12-26 NOTE — Telephone Encounter (Signed)
 OB/GYN referral sent via Epic to [facility]-Toni

## 2023-12-27 ENCOUNTER — Telehealth: Payer: Self-pay | Admitting: Physician Assistant

## 2023-12-27 NOTE — Telephone Encounter (Signed)
Dermatology referral sent via [employer] to [facility] -Vivien Rota

## 2023-12-28 ENCOUNTER — Other Ambulatory Visit: Payer: Self-pay | Admitting: Physician Assistant

## 2023-12-29 LAB — CBC WITH DIFFERENTIAL/PLATELET
Basophils Absolute: 0 10*3/uL (ref 0.0–0.2)
Basos: 0 %
EOS (ABSOLUTE): 0.2 10*3/uL (ref 0.0–0.4)
Eos: 3 %
Hematocrit: 37.8 % (ref 34.0–46.6)
Hemoglobin: 12.4 g/dL (ref 11.1–15.9)
Immature Grans (Abs): 0 10*3/uL (ref 0.0–0.1)
Immature Granulocytes: 0 %
Lymphocytes Absolute: 2.2 10*3/uL (ref 0.7–3.1)
Lymphs: 30 %
MCH: 32 pg (ref 26.6–33.0)
MCHC: 32.8 g/dL (ref 31.5–35.7)
MCV: 98 fL — ABNORMAL HIGH (ref 79–97)
Monocytes Absolute: 0.5 10*3/uL (ref 0.1–0.9)
Monocytes: 7 %
Neutrophils Absolute: 4.3 10*3/uL (ref 1.4–7.0)
Neutrophils: 60 %
Platelets: 208 10*3/uL (ref 150–450)
RBC: 3.87 x10E6/uL (ref 3.77–5.28)
RDW: 12 % (ref 11.7–15.4)
WBC: 7.3 10*3/uL (ref 3.4–10.8)

## 2023-12-29 LAB — COMPREHENSIVE METABOLIC PANEL
ALT: 13 [IU]/L (ref 0–32)
AST: 18 [IU]/L (ref 0–40)
Albumin: 3.9 g/dL (ref 3.9–4.9)
Alkaline Phosphatase: 59 [IU]/L (ref 44–121)
BUN/Creatinine Ratio: 15 (ref 9–23)
BUN: 10 mg/dL (ref 6–24)
Bilirubin Total: 0.5 mg/dL (ref 0.0–1.2)
CO2: 24 mmol/L (ref 20–29)
Calcium: 9.2 mg/dL (ref 8.7–10.2)
Chloride: 103 mmol/L (ref 96–106)
Creatinine, Ser: 0.68 mg/dL (ref 0.57–1.00)
Globulin, Total: 1.8 g/dL (ref 1.5–4.5)
Glucose: 85 mg/dL (ref 70–99)
Potassium: 4.3 mmol/L (ref 3.5–5.2)
Sodium: 138 mmol/L (ref 134–144)
Total Protein: 5.7 g/dL — ABNORMAL LOW (ref 6.0–8.5)
eGFR: 109 mL/min/{1.73_m2} (ref >59)

## 2023-12-29 LAB — LIPID PANEL WITH LDL/HDL RATIO
Cholesterol, Total: 200 mg/dL — ABNORMAL HIGH (ref 100–199)
HDL: 62 mg/dL (ref >39)
LDL Chol Calc (NIH): 126 mg/dL — ABNORMAL HIGH (ref 0–99)
LDL/HDL Ratio: 2 {ratio} (ref 0.0–3.2)
Triglycerides: 65 mg/dL (ref 0–149)
VLDL Cholesterol Cal: 12 mg/dL (ref 5–40)

## 2023-12-29 LAB — TSH: TSH: 0.849 u[IU]/mL (ref 0.450–4.500)

## 2023-12-29 LAB — T4, FREE: Free T4: 1.15 ng/dL (ref 0.82–1.77)

## 2024-01-01 ENCOUNTER — Ambulatory Visit (INDEPENDENT_AMBULATORY_CARE_PROVIDER_SITE_OTHER): Payer: Medicaid Other

## 2024-01-01 ENCOUNTER — Other Ambulatory Visit (HOSPITAL_COMMUNITY)
Admission: RE | Admit: 2024-01-01 | Discharge: 2024-01-01 | Disposition: A | Discharge status: Home / Self Care | Payer: Medicaid Other | Source: Ambulatory Visit

## 2024-01-01 VITALS — BP 104/67 | HR 57 | Ht 68.0 in | Wt 123.0 lb

## 2024-01-01 DIAGNOSIS — Z124 Encounter for screening for malignant neoplasm of cervix: Secondary | ICD-10-CM

## 2024-01-01 NOTE — Addendum Note (Signed)
Addended by: Autumn Messing on: 01/01/2024 01:26 PM   Modules accepted: Orders

## 2024-01-01 NOTE — Progress Notes (Signed)
   GYN ENCOUNTER  Encounter for pap smear  Subjective  HPI: Karen Rollins is a 48 y.o. [id number] who presents today for referral for pap smear from primary care provider.   PCP unable to find cervix with last pap smear, results inconclusive.   History reviewed. No pertinent past medical history. History reviewed. No pertinent surgical history. OB History     Gravida  6   Para  2   Term  2   Preterm      AB  4   Living  2      SAB      IAB  4   Ectopic      Multiple      Live Births  2          Not on File  Review of Systems  12 point ROS negative except for pertinent positives noted in HPO above.   Objective  BP 104/67   Pulse (!) 57   Ht 5\' 8"  (1.727 m)   Wt 123 lb (55.8 kg)   LMP 12/12/2023 (Exact Date)   BMI 18.70 kg/m   Physical examination GENERAL APPEARANCE: alert, well appearing LUNGS: normal work of breathing HEART: normal heart rate Pelvic exam: normal external genitalia, vulva, vagina, cervix, uterus and adnexa, VULVA: normal appearing vulva with no masses, tenderness or lesions, VAGINA: normal appearing vagina with normal color and discharge, no lesions, CERVIX: normal appearing cervix without discharge, nabothian cyst noted at 1 o'clock.  Assessment/Plan - Pap smear collected, will follow up as needed.   Lindalou Hose Brennden Masten, CNM  01/01/24 9:36 AM

## 2024-01-03 ENCOUNTER — Telehealth: Payer: Self-pay | Admitting: Physician Assistant

## 2024-01-03 ENCOUNTER — Telehealth: Payer: Self-pay

## 2024-01-03 LAB — B12 AND FOLATE PANEL
Folate: 5.4 ng/mL (ref >3.0)
Vitamin B-12: 153 pg/mL — ABNORMAL LOW (ref 232–1245)

## 2024-01-03 LAB — SPECIMEN STATUS REPORT

## 2024-01-03 NOTE — Telephone Encounter (Signed)
Received order from [facility]. Gave to Lauren for signature-Toni

## 2024-01-03 NOTE — Telephone Encounter (Signed)
-----   Message from Carlean Jews sent at 01/03/2024  1:28 PM EST ----- Please let her know that her B12 is very low and I recommend starting injections. Total protein a little low. Cholesterol a little high and should work on diet and exercise. May consider fish oil supplement.

## 2024-01-03 NOTE — Telephone Encounter (Signed)
Spoke with patient regarding lab results. Patient says she will discuss B12 injections with husband and let us know if she is willing to do them.

## 2024-01-04 ENCOUNTER — Telehealth: Payer: Self-pay

## 2024-01-04 ENCOUNTER — Other Ambulatory Visit: Payer: Self-pay | Admitting: Physician Assistant

## 2024-01-04 DIAGNOSIS — R195 Other fecal abnormalities: Secondary | ICD-10-CM

## 2024-01-04 LAB — COLOGUARD: COLOGUARD: POSITIVE — AB

## 2024-01-04 NOTE — Telephone Encounter (Signed)
Spoke with patient regarding positive Cologuard and GI referral.

## 2024-01-04 NOTE — Telephone Encounter (Signed)
-----   Message from Carlean Jews sent at 01/04/2024 10:36 AM EST ----- Please let her know that her cologuard screening did come back positive which indicates a need for colonoscopy, therefore I am placing a referral for GI

## 2024-01-08 ENCOUNTER — Telehealth: Payer: Self-pay

## 2024-01-08 ENCOUNTER — Other Ambulatory Visit: Payer: Self-pay

## 2024-01-08 DIAGNOSIS — Z1211 Encounter for screening for malignant neoplasm of colon: Secondary | ICD-10-CM

## 2024-01-08 DIAGNOSIS — R195 Other fecal abnormalities: Secondary | ICD-10-CM

## 2024-01-08 LAB — CYTOLOGY - PAP
Comment: NEGATIVE
High risk HPV: NEGATIVE

## 2024-01-08 MED ORDER — NA SULFATE-K SULFATE-MG SULF 17.5-3.13-1.6 GM/177ML PO SOLN: 1.0000 | Freq: Once | ORAL | 0 refills | Status: AC

## 2024-01-08 NOTE — Telephone Encounter (Signed)
Can you please schedule patient an appointment for Banding #1 with Dr. Tobi Bastos after 02/06/2024. Thank you.

## 2024-01-08 NOTE — Telephone Encounter (Signed)
Gastroenterology Pre-Procedure Review  Request Date: 01/30/24 Requesting Physician: Dr. Tobi Bastos  PATIENT REVIEW QUESTIONS: The patient responded to the following health history questions as indicated:    1. Are you having any GI issues? yes (Positive cologuard, 1st colonoscopy, hemorrhoids since having children) 2. Do you have a personal history of Polyps? no 3. Do you have a family history of Colon Cancer or Polyps? no 4. Diabetes Mellitus? no 5. Joint replacements in the past 12 months?no 6. Major health problems in the past 3 months?no 7. Any artificial heart valves, MVP, or defibrillator?no    MEDICATIONS & ALLERGIES:    Patient reports the following regarding taking any anticoagulation/antiplatelet therapy:   Plavix, Coumadin, Eliquis, Xarelto, Lovenox, Pradaxa, Brilinta, or Effient? no Aspirin? no  Patient confirms/reports the following medications:  No current outpatient medications on file.   No current facility-administered medications for this visit.    Patient confirms/reports the following allergies:  No Known Allergies  No orders of the defined types were placed in this encounter.   AUTHORIZATION INFORMATION Primary Insurance: 1D#: Group #:  Secondary Insurance: 1D#: Group #:  SCHEDULE INFORMATION: Date:  Time: Location:

## 2024-01-08 NOTE — Telephone Encounter (Signed)
Patient is schedule for 02/12/24 at 1:30 pm.

## 2024-01-08 NOTE — Telephone Encounter (Signed)
Pt requesting call back to schedule colonoscopy.

## 2024-01-30 ENCOUNTER — Ambulatory Visit
Admission: RE | Admit: 2024-01-30 | Discharge: 2024-01-30 | Disposition: A | Discharge status: Home / Self Care | Payer: Medicaid Other | Attending: Gastroenterology | Admitting: Gastroenterology

## 2024-01-30 ENCOUNTER — Ambulatory Visit: Payer: Medicaid Other | Admitting: Anesthesiology

## 2024-01-30 ENCOUNTER — Encounter
Admission: RE | Disposition: A | Discharge status: Home / Self Care | Payer: Self-pay | Source: Home / Self Care | Attending: Gastroenterology

## 2024-01-30 DIAGNOSIS — F17210 Nicotine dependence, cigarettes, uncomplicated: Secondary | ICD-10-CM | POA: Diagnosis not present

## 2024-01-30 DIAGNOSIS — K621 Rectal polyp: Secondary | ICD-10-CM | POA: Diagnosis not present

## 2024-01-30 DIAGNOSIS — D120 Benign neoplasm of cecum: Secondary | ICD-10-CM | POA: Insufficient documentation

## 2024-01-30 DIAGNOSIS — R195 Other fecal abnormalities: Secondary | ICD-10-CM

## 2024-01-30 DIAGNOSIS — K635 Polyp of colon: Secondary | ICD-10-CM | POA: Diagnosis not present

## 2024-01-30 DIAGNOSIS — K640 First degree hemorrhoids: Secondary | ICD-10-CM | POA: Diagnosis not present

## 2024-01-30 DIAGNOSIS — Z1211 Encounter for screening for malignant neoplasm of colon: Secondary | ICD-10-CM | POA: Insufficient documentation

## 2024-01-30 DIAGNOSIS — D126 Benign neoplasm of colon, unspecified: Secondary | ICD-10-CM

## 2024-01-30 DIAGNOSIS — K623 Rectal prolapse: Secondary | ICD-10-CM

## 2024-01-30 HISTORY — PX: POLYPECTOMY: SHX5525

## 2024-01-30 HISTORY — PX: COLONOSCOPY WITH PROPOFOL: SHX5780

## 2024-01-30 LAB — POCT PREGNANCY, URINE: Preg Test, Ur: NEGATIVE

## 2024-01-30 SURGERY — COLONOSCOPY WITH PROPOFOL
Anesthesia: General

## 2024-01-30 MED ORDER — DEXMEDETOMIDINE HCL IN NACL 80 MCG/20ML IV SOLN
INTRAVENOUS | Status: DC | PRN
  Administered 2024-01-30: 8 ug via INTRAVENOUS

## 2024-01-30 MED ORDER — SODIUM CHLORIDE 0.9 % IV SOLN
INTRAVENOUS | Status: DC
  Administered 2024-01-30: 40 mL/h via INTRAVENOUS

## 2024-01-30 MED ORDER — PROPOFOL 500 MG/50ML IV EMUL
INTRAVENOUS | Status: DC | PRN
  Administered 2024-01-30: 150 ug/kg/min via INTRAVENOUS

## 2024-01-30 MED ORDER — LIDOCAINE HCL (CARDIAC) PF 100 MG/5ML IV SOSY
PREFILLED_SYRINGE | INTRAVENOUS | Status: DC | PRN
  Administered 2024-01-30: 40 mg via INTRAVENOUS

## 2024-01-30 MED ORDER — EPHEDRINE SULFATE-NACL 50-0.9 MG/10ML-% IV SOSY
PREFILLED_SYRINGE | INTRAVENOUS | Status: DC | PRN
  Administered 2024-01-30: 10 mg via INTRAVENOUS

## 2024-01-30 MED ORDER — PROPOFOL 10 MG/ML IV BOLUS
INTRAVENOUS | Status: DC | PRN
  Administered 2024-01-30: 90 mg via INTRAVENOUS

## 2024-01-30 NOTE — Anesthesia Procedure Notes (Signed)
Date/Time: 01/30/2024 9:28 AM  Performed by: Stormy Fabian, CRNAPre-anesthesia Checklist: Patient identified, Emergency Drugs available, Suction available and Patient being monitored Patient Re-evaluated:Patient Re-evaluated prior to induction Oxygen Delivery Method: Nasal cannula Induction Type: IV induction Dental Injury: Teeth and Oropharynx as per pre-operative assessment  Comments: Nasal cannula with etCO2 monitoring

## 2024-01-30 NOTE — Transfer of Care (Signed)
Immediate Anesthesia Transfer of Care Note  Patient: Karen Rollins  Procedure(s) Performed: Procedure(s): COLONOSCOPY WITH PROPOFOL (N/A) POLYPECTOMY  Patient Location: PACU and Endoscopy Unit  Anesthesia Type:General  Level of Consciousness: sedated  Airway & Oxygen Therapy: Patient Spontanous Breathing and Patient connected to nasal cannula oxygen  Post-op Assessment: Report given to RN and Post -op Vital signs reviewed and stable  Post vital signs: Reviewed and stable  Last Vitals:  Vitals:   01/30/24 0849 01/30/24 0941  BP: (!) 123/40 (!) 101/42  Pulse: 68 76  Resp: 20 14  Temp: 36.5 C 36.4 C  SpO2: 99% 100%    Complications: No apparent anesthesia complications

## 2024-01-30 NOTE — Op Note (Signed)
[facility] Gastroenterology Patient Name: Karen Rollins Procedure Date: 01/30/2024 9:21 AM MRN: [id number] Account #: [id number] Date of Birth: August 14, 1976 Admit Type: Outpatient Age: 48 Room: [facility] ENDO ROOM 3 Gender: Female Note Status: Finalized Instrument Name: Peds Colonoscope [id number] Procedure:             Colonoscopy Indications:           Screening for colorectal malignant neoplasm due to                         positive Cologuard test Providers:             [name] MD, MD Medicines:             Monitored Anesthesia Care Complications:         No immediate complications. Procedure:             Pre-Anesthesia Assessment:                        - Prior to the procedure, a History and Physical was                         performed, and patient medications, allergies and                         sensitivities were reviewed. The patient's tolerance                         of previous anesthesia was reviewed.                        - The risks and benefits of the procedure and the                         sedation options and risks were discussed with the                         patient. All questions were answered and informed                         consent was obtained.                        - ASA Grade Assessment: II - A patient with mild                         systemic disease.                        After obtaining informed consent, the colonoscope was                         passed under direct vision. Throughout the procedure,                         the patient's blood pressure, pulse, and oxygen                         saturations were monitored continuously. The  Colonoscope was introduced through the anus and                         advanced to the the cecum, identified by the                         appendiceal orifice. The colonoscopy was performed                         with ease. The patient tolerated the procedure  well.                         The quality of the bowel preparation was excellent.                         The ileocecal valve, appendiceal orifice, and rectum                         were photographed. Findings:      The perianal and digital rectal examinations were normal.      Three sessile polyps were found in the rectum, sigmoid colon and cecum.       The polyps were 3 to 4 mm in size. These polyps were removed with a cold       snare. Resection and retrieval were complete.      Non-bleeding internal hemorrhoids were found during retroflexion. The       hemorrhoids were medium-sized and Grade I (internal hemorrhoids that do       not prolapse).      The exam was otherwise without abnormality on direct and retroflexion       views. Impression:            - Three 3 to 4 mm polyps in the rectum, in the sigmoid                         colon and in the cecum, removed with a cold snare.                         Resected and retrieved.                        - Non-bleeding internal hemorrhoids.                        - The examination was otherwise normal on direct and                         retroflexion views. Recommendation:        - Discharge patient to home (with escort).                        - Resume previous diet.                        - Continue present medications.                        - Await pathology results.                        -  Repeat colonoscopy for surveillance and for                         surveillance based on pathology results. Procedure Code(s):     --- Professional ---                        [id number], Colonoscopy, flexible; with removal of                         tumor(s), polyp(s), or other lesion(s) by snare                         technique Diagnosis Code(s):     --- Professional ---                        R19.5, Other fecal abnormalities                        Z12.11, Encounter for screening for malignant neoplasm                         of colon                         D12.8, Benign neoplasm of rectum                        D12.5, Benign neoplasm of sigmoid colon                        D12.0, Benign neoplasm of cecum                        K64.0, First degree hemorrhoids CPT copyright 2022 American Medical Association. All rights reserved. The codes documented in this report are preliminary and upon coder review may  be revised to meet current compliance requirements. [name], MD [name] MD, MD 01/30/2024 9:40:00 AM This report has been signed electronically. Number of Addenda: 0 Note Initiated On: 01/30/2024 9:21 AM Scope Withdrawal Time: 0 hours 8 minutes 48 seconds  Total Procedure Duration: 0 hours 11 minutes 3 seconds  Estimated Blood Loss:  Estimated blood loss: none.      [facility]

## 2024-01-30 NOTE — Anesthesia Preprocedure Evaluation (Signed)
Anesthesia Evaluation  Patient identified by MRN, date of birth, ID band Patient awake    Reviewed: Allergy & Precautions, NPO status , Patient's Chart, lab work & pertinent test results  Airway Mallampati: II  TM Distance: >3 FB Neck ROM: full    Dental  (+) Teeth Intact   Pulmonary neg pulmonary ROS, Current Smoker and Patient abstained from smoking.   Pulmonary exam normal breath sounds clear to auscultation       Cardiovascular Exercise Tolerance: Good negative cardio ROS Normal cardiovascular exam Rhythm:Regular Rate:Normal     Neuro/Psych negative neurological ROS  negative psych ROS   GI/Hepatic negative GI ROS, Neg liver ROS,,,  Endo/Other  negative endocrine ROS    Renal/GU negative Renal ROS  negative genitourinary   Musculoskeletal   Abdominal  (+) + scaphoid   Peds negative pediatric ROS (+)  Hematology negative hematology ROS (+)   Anesthesia Other Findings No past medical history on file.  No past surgical history on file.  BMI    Body Mass Index: 17.67 kg/m      Reproductive/Obstetrics negative OB ROS                             Anesthesia Physical Anesthesia Plan  ASA: 2  Anesthesia Plan: General   Post-op Pain Management:    Induction: Intravenous  PONV Risk Score and Plan: Propofol infusion and TIVA  Airway Management Planned: Natural Airway and Nasal Cannula  Additional Equipment:   Intra-op Plan:   Post-operative Plan:   Informed Consent: I have reviewed the patients History and Physical, chart, labs and discussed the procedure including the risks, benefits and alternatives for the proposed anesthesia with the patient or authorized representative who has indicated his/her understanding and acceptance.     Dental Advisory Given  Plan Discussed with: CRNA and Surgeon  Anesthesia Plan Comments:        Anesthesia Quick Evaluation

## 2024-01-30 NOTE — H&P (Signed)
     [name], MD [address], [place], [place], [address] [address], [place], [place], [address] Phone: [phone]  Fax: [phone]  Primary Care Physician:  Carlean Jews, PA-C   Pre-Procedure History & Physical: HPI:  Karen Rollins is a 48 y.o. female is here for an colonoscopy.   No past medical history on file.  No past surgical history on file.  Prior to Admission medications   Not on File    Allergies as of 01/08/2024   (No Known Allergies)    Family History  Problem Relation Age of Onset   Cancer Mother    Breast cancer Mother 13   Hypertension Father    Cancer Maternal Grandmother    Breast cancer Maternal Grandmother    Stroke Paternal Grandmother    Breast cancer Paternal Grandmother     Social History   Socioeconomic History   Marital status: Single    Spouse name: Not on file   Number of children: Not on file   Years of education: Not on file   Highest education level: Not on file  Occupational History   Not on file  Tobacco Use   Smoking status: Every Day    Current packs/day: 0.50    Types: Cigarettes   Smokeless tobacco: Never  Substance and Sexual Activity   Alcohol use: Yes    Comment: occasionally   Drug use: Yes    Types: Marijuana   Sexual activity: Not on file  Other Topics Concern   Not on file  Social History Narrative   Not on file   Social Drivers of Health   Financial Resource Strain: Not on file  Food Insecurity: Not on file  Transportation Needs: Not on file  Physical Activity: Not on file  Stress: Not on file  Social Connections: Not on file  Intimate Partner Violence: Not on file    Review of Systems: See HPI, otherwise negative ROS  Physical Exam: LMP 12/12/2023 (Exact Date)  General:   Alert,  pleasant and cooperative in NAD Head:  Normocephalic and atraumatic. Neck:  Supple; no masses or thyromegaly. Lungs:  Clear throughout to auscultation, normal  respiratory effort.    Heart:  +S1, +S2, Regular rate and rhythm, No edema. Abdomen:  Soft, nontender and nondistended. Normal bowel sounds, without guarding, and without rebound.   Neurologic:  Alert and  oriented x4;  grossly normal neurologically.  Impression/Plan: Karen Rollins is here for an colonoscopy to be performed for Screening colonoscopy positive cologuyard Risks, benefits, limitations, and alternatives regarding  colonoscopy have been reviewed with the patient.  Questions have been answered.  All parties agreeable.   [name], MD  01/30/2024, 8:14 AM

## 2024-01-30 NOTE — Anesthesia Postprocedure Evaluation (Signed)
Anesthesia Post Note  Patient: Karen Rollins  Procedure(s) Performed: COLONOSCOPY WITH PROPOFOL POLYPECTOMY  Patient location during evaluation: PACU Anesthesia Type: General Level of consciousness: awake Pain management: pain level controlled Vital Signs Assessment: post-procedure vital signs reviewed and stable Respiratory status: spontaneous breathing Cardiovascular status: stable Anesthetic complications: no   No notable events documented.   Last Vitals:  Vitals:   01/30/24 0941 01/30/24 0951  BP: (!) 101/42 103/73  Pulse: 76 74  Resp: 14 (!) 22  Temp: 36.4 C   SpO2: 100% 100%    Last Pain:  Vitals:   01/30/24 0951  TempSrc:   PainSc: 0-No pain                 VAN STAVEREN,Zimere Dunlevy

## 2024-01-31 ENCOUNTER — Encounter: Payer: Self-pay | Admitting: Gastroenterology

## 2024-01-31 LAB — SURGICAL PATHOLOGY

## 2024-02-11 ENCOUNTER — Telehealth: Payer: Self-pay

## 2024-02-11 NOTE — Telephone Encounter (Signed)
 Patient notified

## 2024-02-11 NOTE — Telephone Encounter (Signed)
 Called patient back and she stated that she was calling because she started with her menstrual cycle on Friday but she is almost done. Patient stated that she could wear a tampon but didn't want to reschedule since she's been waiting for this appointment for months. She was just calling to let us know.

## 2024-02-12 ENCOUNTER — Ambulatory Visit (INDEPENDENT_AMBULATORY_CARE_PROVIDER_SITE_OTHER): Payer: Medicaid Other | Admitting: Gastroenterology

## 2024-02-12 DIAGNOSIS — K648 Other hemorrhoids: Secondary | ICD-10-CM

## 2024-02-12 DIAGNOSIS — K641 Second degree hemorrhoids: Secondary | ICD-10-CM | POA: Diagnosis not present

## 2024-02-12 NOTE — Progress Notes (Signed)
 Patient follow-ups today for banding of hemorrhoids    Summary of history :  Patient is here today to have her hemorrhoids banded.  Noted to have nonbleeding internal hemorrhoids on recent colonoscopy on 01/30/2024.  Hemoglobin 12.4 g in January 2025.  Protrusion and discomfort from the hemorrhoid tried conservative management at home has not helped.  When  bleeds and bleeds like she is having a cycle.  Digital rectal exam performed in the presence of a chaperone.  CMA Maritza External anal findings: Normal Internal findings: Normal, No masses, no blood on glove noticed.    PROCEDURE NOTE: The patient presents with symptomatic grade 2 hemorrhoids, unresponsive to maximal medical therapy, requesting rubber band ligation of his/her hemorrhoidal disease.  All risks, benefits and alternative forms of therapy were described and informed consent was obtained.  In the Left Lateral Decubitus position (if anoscopy is performed) anoscopic examination revealed grade 2 hemorrhoids in the all position(s).   The decision was made to band the LL internal hemorrhoid, and the [facility] O'Regan System was used to perform band ligation without complication.  Digital anorectal examination was then performed to assure proper positioning of the band, and to adjust the banded tissue as required.  The patient was discharged home without pain or other issues.  Dietary and behavioral recommendations were given and (if necessary - prescriptions were given), along with follow-up instructions.  The patient will return 4 weeks for follow-up and possible additional banding as required.  No complications were encountered and the patient tolerated the procedure well.   Plan:  Avoid constipation.  Take MiraLAX if needed Follow-up: 4 Weeks  Dr [name] MD,MRCP [facility]) Gastroenterology/Hepatology Pager: [phone]

## 2024-02-19 ENCOUNTER — Telehealth: Payer: Self-pay | Admitting: Physician Assistant

## 2024-02-19 NOTE — Telephone Encounter (Signed)
 Dermatology appointment 04/22/2024 @ [facility]

## 2024-03-14 ENCOUNTER — Other Ambulatory Visit: Payer: Self-pay

## 2024-03-17 ENCOUNTER — Ambulatory Visit (INDEPENDENT_AMBULATORY_CARE_PROVIDER_SITE_OTHER): Admitting: Gastroenterology

## 2024-03-17 VITALS — BP 118/76 | HR 82 | Temp 98.1°F | Wt 118.6 lb

## 2024-03-17 DIAGNOSIS — K640 First degree hemorrhoids: Secondary | ICD-10-CM | POA: Diagnosis not present

## 2024-03-17 DIAGNOSIS — K648 Other hemorrhoids: Secondary | ICD-10-CM

## 2024-03-17 NOTE — Progress Notes (Signed)
 Patient follow-ups today for banding of hemorrhoids    Summary of history : Patient is here today to have her hemorrhoids banded.  Noted to have nonbleeding internal hemorrhoids on recent colonoscopy on 01/30/2024.  Hemoglobin 12.4 g in January 2025.   Protrusion and discomfort from the hemorrhoid tried conservative management at home has not helped.  When  bleeds and bleeds like she is having a cycle.   First round: 02/12/2024: Left lateral column banded   Interval history     She did great after the last round of banding.  Bleeding significantly reduced.  Digital rectal exam performed in the presence of a chaperone. External anal findings: normal Internal findings: , No masses, no blood on glove noticed.    PROCEDURE NOTE: The patient presents with symptomatic grade 1 hemorrhoids, unresponsive to maximal medical therapy, requesting rubber band ligation of his/her hemorrhoidal disease.  All risks, benefits and alternative forms of therapy were described and informed consent was obtained.  In the Left Lateral Decubitus position (if anoscopy is performed) anoscopic examination revealed grade 1 hemorrhoids in the RA and RP position(s).   The decision was made to band the RA internal hemorrhoid, and the [facility] O'Regan System was used to perform band ligation without complication.  Digital anorectal examination was then performed to assure proper positioning of the band, and to adjust the banded tissue as required.  The patient was discharged home without pain or other issues.  Dietary and behavioral recommendations were given and (if necessary - prescriptions were given), along with follow-up instructions.  The patient will return 4 weeks for follow-up and possible additional banding as required.  No complications were encountered and the patient tolerated the procedure well.   Plan:  Avoid constipation.  Commence on stool softeners if not already on  Follow-up: 4 weeks  Dr [name]  MD,MRCP [facility]) Gastroenterology/Hepatology Pager: [phone]

## 2024-04-03 ENCOUNTER — Encounter: Payer: Self-pay | Admitting: Gastroenterology

## 2024-04-03 ENCOUNTER — Telehealth: Payer: Self-pay | Admitting: Gastroenterology

## 2024-04-03 NOTE — Telephone Encounter (Signed)
 I called the patient to reschedule her appointment with Dr. Antony Baumgartner, as he will be out of town on Apr 14, 2024, and will return on Apr 21, 2024. The patient did not answer, so I left a voicemail requesting that she call us  back. I will also send her a MyChart message regarding the appointment change and to ask that she contact us .

## 2024-04-14 ENCOUNTER — Ambulatory Visit: Admitting: Gastroenterology

## 2024-04-21 ENCOUNTER — Ambulatory Visit: Admitting: Gastroenterology

## 2024-04-21 ENCOUNTER — Encounter: Payer: Self-pay | Admitting: Gastroenterology

## 2024-04-21 VITALS — BP 117/63 | HR 65 | Temp 98.5°F | Ht 68.0 in | Wt 111.0 lb

## 2024-04-21 DIAGNOSIS — K648 Other hemorrhoids: Secondary | ICD-10-CM

## 2024-04-21 DIAGNOSIS — K640 First degree hemorrhoids: Secondary | ICD-10-CM

## 2024-04-21 NOTE — Progress Notes (Signed)
 Patient follow-ups today for banding of hemorrhoids    Summary of history : Summary of history : Patient is here today to have her hemorrhoids banded.  Noted to have nonbleeding internal hemorrhoids on recent colonoscopy on 01/30/2024.  Hemoglobin 12.4 g in January 2025.   Protrusion and discomfort from the hemorrhoid tried conservative management at home has not helped.  When  bleeds and bleeds like she is having a cycle.     First round: 02/12/2024: Left lateral column banded Second round: 03/17/2024: RA column banded    Interval history     Doing well no bleeding  CMA chan in room   Digital rectal exam performed in the presence of a chaperone. External anal findings: normal  Internal findings: , No masses, no blood on glove noticed.    PROCEDURE NOTE: The patient presents with symptomatic grade 1 hemorrhoids, unresponsive to maximal medical therapy, requesting rubber band ligation of his/her hemorrhoidal disease.  All risks, benefits and alternative forms of therapy were described and informed consent was obtained.  In the Left Lateral Decubitus position (if anoscopy is performed) anoscopic examination revealed grade 1 hemorrhoids in the RP position(s).   The decision was made to band the RP internal hemorrhoid, and the [facility] O'Regan System was used to perform band ligation without complication.  Digital anorectal examination was then performed to assure proper positioning of the band, and to adjust the banded tissue as required.  The patient was discharged home without pain or other issues.  Dietary and behavioral recommendations were given and (if necessary - prescriptions were given), along with follow-up instructions.  The patient will return   as needed for follow-up and possible additional banding as required.  No complications were encountered and the patient tolerated the procedure well.   Plan:  Avoid constipation.    Follow-up:as needed  Dr Luke Salaam MD,MRCP  [facility]) Gastroenterology/Hepatology Pager: [phone]

## 2024-12-22 ENCOUNTER — Encounter: Payer: Medicaid Other | Admitting: Physician Assistant

## 2024-12-29 ENCOUNTER — Ambulatory Visit: Payer: Self-pay | Admitting: Physician Assistant
